# Patient Record
Sex: Female | Born: 1935 | Race: White | Hispanic: No | Marital: Married
Health system: Midwestern US, Academic
[De-identification: ages and names within clinical notes are randomized; demographics above are authoritative.]

---

## 2017-04-15 ENCOUNTER — Ambulatory Visit: Admit: 2017-04-15 | Discharge: 2017-04-16 | Payer: MEDICARE

## 2017-04-15 DIAGNOSIS — R011 Cardiac murmur, unspecified: Principal | ICD-10-CM

## 2017-04-16 ENCOUNTER — Encounter: Admit: 2017-04-16 | Discharge: 2017-04-16 | Payer: MEDICARE

## 2017-04-16 DIAGNOSIS — R011 Cardiac murmur, unspecified: Principal | ICD-10-CM

## 2017-06-02 ENCOUNTER — Encounter: Admit: 2017-06-02 | Discharge: 2017-06-02 | Payer: MEDICARE

## 2017-06-02 DIAGNOSIS — I4891 Unspecified atrial fibrillation: Principal | ICD-10-CM

## 2017-06-02 DIAGNOSIS — I10 Essential (primary) hypertension: ICD-10-CM

## 2017-06-18 ENCOUNTER — Encounter: Admit: 2017-06-18 | Discharge: 2017-06-18 | Payer: MEDICARE

## 2017-06-18 ENCOUNTER — Ambulatory Visit: Admit: 2017-06-18 | Discharge: 2017-06-19 | Payer: MEDICARE

## 2017-06-18 DIAGNOSIS — I480 Paroxysmal atrial fibrillation: ICD-10-CM

## 2017-06-18 DIAGNOSIS — I4891 Unspecified atrial fibrillation: Principal | ICD-10-CM

## 2017-06-18 DIAGNOSIS — I10 Essential (primary) hypertension: ICD-10-CM

## 2017-06-18 DIAGNOSIS — I350 Nonrheumatic aortic (valve) stenosis: Principal | ICD-10-CM

## 2017-06-18 NOTE — Assessment & Plan Note
She has very infrequent episodes of palpitation that only last for a few minutes.  There are no associated symptoms.

## 2017-06-18 NOTE — Assessment & Plan Note
The echocardiogram in June shows moderate aortic stenosis with normal left ventricular systolic function.  Her aortic stenosis is not symptomatic and I would not suggest any additional diagnostic studies at this time.

## 2017-06-18 NOTE — Progress Notes
Date of Service: 06/18/2017    Elizabeth Austin is a 83 y.o. female.       HPI     Elizabeth Austin and her husband were in the [place] office today for follow-up regarding paroxysmal atrial arrhythmias and moderate aortic stenosis.    Occasional episodes of fast heart beat lasting a few minutes.  No associated symptoms--no light headedness, dyspnea, or chest discomfort.         Vitals:    06/18/17 1446 06/18/17 1500   BP: 124/66 130/74   Pulse: 60    Weight: 50 kg (110 lb 3.2 oz)    Height: 1.499 m (4' 11)      Body mass index is 22.26 kg/m???.     Past Medical History  Patient Active Problem List    Diagnosis Date Noted   ??? Diastolic dysfunction 05/17/2013     2009 - Mild LV diastolic dysfunction by echocardiogram.  Mild LVH.  04/2013 - Mild LV diastolic dysfunction by echocardiogram.  Mild LVH.     ??? Osteoarthritis 05/17/2013     Circa 1993 - Pelvic fracture after fall  Chronic left hip pain.     ??? Spinal stenosis 05/17/2013   ??? Lower extremity edema 05/16/2013   ??? Paroxysmal atrial fibrillation (HCC) 05/16/2013     2009 - Event monitor [facility]) sinus rhythm  Circa 2011 - Anti-coagulation started by Dr. Shaune Spittle.    04/2012 - Atrial fibrillation by exam at Dr. Gilles Chiquito office     ??? Hypertension 05/16/2013   ??? Aortic stenosis 05/16/2013   ??? Hypothyroid 05/16/2013   ??? Abdominal pain 06/30/2011   ??? Constipation 06/30/2011         Review of Systems   Constitution: Positive for weakness.   HENT: Positive for hearing loss.    Eyes: Negative.    Cardiovascular: Negative.    Respiratory: Negative.    Endocrine: Negative.    Hematologic/Lymphatic: Negative.    Skin: Negative.    Musculoskeletal: Positive for arthritis and back pain.   Gastrointestinal: Positive for flatus.   Genitourinary: Negative.    Psychiatric/Behavioral: Negative.    Allergic/Immunologic: Negative.        Physical Exam    Physical Exam   General Appearance: no distress   Skin: warm, no ulcers or xanthomas   Digits and Nails: no cyanosis or clubbing Eyes: conjunctivae and lids normal, pupils are equal and round   Teeth/Gums/Palate: dentition unremarkable, no lesions   Lips & Oral Mucosa: no pallor or cyanosis   Neck Veins: normal JVP , neck veins are not distended   Thyroid: no nodules, masses, tenderness or enlargement   Chest Inspection: chest is normal in appearance   Respiratory Effort: breathing comfortably, no respiratory distress   Auscultation/Percussion: lungs clear to auscultation, no rales or rhonchi, no wheezing   PMI: PMI not enlarged or displaced   Cardiac Rhythm: regular rhythm and normal rate   Cardiac Auscultation: S1, S2 normal, no rub, no gallop   Murmurs: grade 2 systolic murmur   Peripheral Circulation: normal peripheral circulation   Carotid Arteries: normal carotid upstroke bilaterally, no bruits   Radial Arteries: normal symmetric radial pulses   Abdominal Aorta: no abdominal aortic bruit   Pedal Pulses: normal symmetric pedal pulses   Lower Extremity Edema: no lower extremity edema   Abdominal Exam: soft, non-tender, no masses, bowel sounds normal   Liver & Spleen: no organomegaly   Gait & Station: walks without assistance   Muscle Strength: normal muscle tone  Orientation: oriented to time, place and person   Affect & Mood: appropriate and sustained affect   Language and Memory: patient responsive and seems to comprehend information   Neurologic Exam: neurological assessment grossly intact   Other: moves all extremities      Cardiovascular Studies    EKG:  Sinus rhythm, rate 60.  RBBB.    Problems Addressed Today  Encounter Diagnoses   Name Primary?   ??? Nonrheumatic aortic valve stenosis    ??? Essential hypertension    ??? Paroxysmal atrial fibrillation (HCC)        Assessment and Plan       Aortic stenosis  The echocardiogram in June shows moderate aortic stenosis with normal left ventricular systolic function.  Her aortic stenosis is not symptomatic and I would not suggest any additional diagnostic studies at this time. Hypertension  Blood pressure seems nicely controlled on the current medical program.    Paroxysmal atrial fibrillation  She has very infrequent episodes of palpitation that only last for a few minutes.  There are no associated symptoms.      Current Medications (including today's revisions)  ??? acetaminophen (TYLENOL) 500 mg tablet Take 500 mg by mouth as Needed for Pain. Max of 4,000 mg of acetaminophen in 24 hours.   ??? antiox #8/om3/dha/epa/lut/zeax (PRESERVISION AREDS 2 (OMEGA-3) PO) Take 1 tablet by mouth daily.   ??? ergocalciferol (vitamin D2) (VITAMIN D PO) Take  by mouth daily.   ??? levothyroxine (SYNTHROID) 75 mcg tablet Take 75 mcg by mouth daily.   ??? lisinopril/hydrochlorothiazide (ZESTORETIC) 20/12.5 mg tablet Take  by mouth daily.   ??? metoprolol (LOPRESSOR) 25 mg tablet Take 25 mg by mouth daily.

## 2017-06-18 NOTE — Assessment & Plan Note
Blood pressure seems nicely controlled on the current medical program.

## 2017-08-10 ENCOUNTER — Emergency Department: Admit: 2017-08-10 | Discharge: 2017-08-10 | Payer: MEDICARE | Attending: Surgical Critical Care

## 2017-08-10 ENCOUNTER — Encounter: Admit: 2017-08-10 | Discharge: 2017-08-10 | Payer: MEDICARE

## 2017-08-10 DIAGNOSIS — M25561 Pain in right knee: ICD-10-CM

## 2017-08-10 DIAGNOSIS — S22029A Unspecified fracture of second thoracic vertebra, initial encounter for closed fracture: ICD-10-CM

## 2017-08-10 DIAGNOSIS — I4891 Unspecified atrial fibrillation: Principal | ICD-10-CM

## 2017-08-10 DIAGNOSIS — Z7409 Other reduced mobility: ICD-10-CM

## 2017-08-10 DIAGNOSIS — T1490XA Injury, unspecified, initial encounter: ICD-10-CM

## 2017-08-10 DIAGNOSIS — I10 Essential (primary) hypertension: ICD-10-CM

## 2017-08-10 MED ORDER — IOPAMIDOL 76 % IV SOLN
80 mL | Freq: Once | INTRAVENOUS | 0 refills | Status: CP
Start: 2017-08-10 — End: ?
  Administered 2017-08-11: 04:00:00 80 mL via INTRAVENOUS

## 2017-08-10 MED ORDER — FENTANYL CITRATE (PF) 50 MCG/ML IJ SOLN
25 ug | Freq: Once | INTRAVENOUS | 0 refills | Status: CP
Start: 2017-08-10 — End: ?
  Administered 2017-08-11: 04:00:00 25 ug via INTRAVENOUS

## 2017-08-10 MED ORDER — SODIUM CHLORIDE 0.9 % IJ SOLN
50 mL | Freq: Once | INTRAVENOUS | 0 refills | Status: CP
Start: 2017-08-10 — End: ?
  Administered 2017-08-11: 04:00:00 50 mL via INTRAVENOUS

## 2017-08-11 ENCOUNTER — Encounter: Admit: 2017-08-11 | Discharge: 2017-08-11 | Payer: MEDICARE

## 2017-08-11 ENCOUNTER — Inpatient Hospital Stay: Admit: 2017-08-11 | Discharge: 2017-08-11 | Payer: MEDICARE

## 2017-08-11 DIAGNOSIS — M25561 Pain in right knee: ICD-10-CM

## 2017-08-11 DIAGNOSIS — I4891 Unspecified atrial fibrillation: Principal | ICD-10-CM

## 2017-08-11 DIAGNOSIS — I10 Essential (primary) hypertension: ICD-10-CM

## 2017-08-11 LAB — URINALYSIS DIPSTICK
Lab: 6 mg/dL — ABNORMAL HIGH (ref 5.0–8.0)
Lab: NEGATIVE U/L (ref 7–40)
Lab: NEGATIVE g/dL (ref 3.5–5.0)
Lab: NEGATIVE g/dL — ABNORMAL HIGH (ref 6.0–8.0)
Lab: NEGATIVE mg/dL (ref 0.4–1.00)
Lab: NEGATIVE mg/dL (ref 8.5–10.6)
Lab: NEGATIVE mg/dL — ABNORMAL LOW (ref 0.3–1.2)
Lab: POSITIVE U/L — AB (ref 7–56)

## 2017-08-11 LAB — POC TROPONIN: Lab: 0 ng/mL (ref 0.00–0.05)

## 2017-08-11 LAB — COMPREHENSIVE METABOLIC PANEL
Lab: 135 MMOL/L — ABNORMAL LOW (ref 137–147)
Lab: 8 10*3/uL (ref 3–12)
Lab: >60 mL/min (ref >60)
Lab: >60 mL/min (ref >60)

## 2017-08-11 LAB — URINALYSIS, MICROSCOPIC

## 2017-08-11 LAB — PTT (APTT): Lab: 21 s (ref 20.0–36.0)

## 2017-08-11 LAB — CBC AND DIFF: Lab: 11 10*3/uL — ABNORMAL HIGH (ref 4.5–11.0)

## 2017-08-11 LAB — PROTIME INR (PT): Lab: 1 M/UL — ABNORMAL LOW (ref 0.8–1.2)

## 2017-08-11 LAB — POC CREATININE, RAD: Lab: 0.7 mg/dL (ref 0.4–1.00)

## 2017-08-11 MED ORDER — FENTANYL CITRATE (PF) 50 MCG/ML IJ SOLN
12.5-25 ug | INTRAVENOUS | 0 refills | Status: DC | PRN
Start: 2017-08-11 — End: 2017-08-12
  Administered 2017-08-11: 07:00:00 25 ug via INTRAVENOUS
  Administered 2017-08-11: 14:00:00 12.5 ug via INTRAVENOUS

## 2017-08-11 MED ORDER — LISINOPRIL-HYDROCHLOROTHIAZIDE 20-12.5 MG TAB
Freq: Every day | ORAL | 0 refills | Status: DC
Start: 2017-08-11 — End: 2017-08-13
  Administered 2017-08-11 – 2017-08-13 (×6): via ORAL

## 2017-08-11 MED ORDER — OXYCODONE 5 MG PO TAB
5-10 mg | Freq: Once | ORAL | 0 refills | Status: DC | PRN
Start: 2017-08-11 — End: 2017-08-12

## 2017-08-11 MED ORDER — FENTANYL CITRATE (PF) 50 MCG/ML IJ SOLN
25 ug | INTRAVENOUS | 0 refills | Status: DC | PRN
Start: 2017-08-11 — End: 2017-08-12

## 2017-08-11 MED ORDER — HYDROMORPHONE (PF) 2 MG/ML IJ SYRG
1 mg | INTRAVENOUS | 0 refills | Status: DC | PRN
Start: 2017-08-11 — End: 2017-08-12

## 2017-08-11 MED ORDER — KETAMINE 10 MG/ML IJ SOLN
0 refills | Status: DC
Start: 2017-08-11 — End: 2017-08-11
  Administered 2017-08-11 (×2): 10 mg via INTRAVENOUS

## 2017-08-11 MED ORDER — CEFAZOLIN INJ 1GM IVP
1 g | INTRAVENOUS | 0 refills | Status: CP
Start: 2017-08-11 — End: ?
  Administered 2017-08-12 (×2): 1 g via INTRAVENOUS

## 2017-08-11 MED ORDER — DIPHENHYDRAMINE HCL 50 MG/ML IJ SOLN
25 mg | Freq: Once | INTRAVENOUS | 0 refills | Status: DC | PRN
Start: 2017-08-11 — End: 2017-08-12

## 2017-08-11 MED ORDER — LACTATED RINGERS IV SOLP
1000 mL | INTRAVENOUS | 0 refills | Status: DC
Start: 2017-08-11 — End: 2017-08-12
  Administered 2017-08-11: 19:00:00 1000 mL via INTRAVENOUS

## 2017-08-11 MED ORDER — SENNOSIDES-DOCUSATE SODIUM 8.6-50 MG PO TAB
1 | Freq: Two times a day (BID) | ORAL | 0 refills | Status: DC
Start: 2017-08-11 — End: 2017-08-13
  Administered 2017-08-12 – 2017-08-13 (×4): 1 via ORAL

## 2017-08-11 MED ORDER — OXYCODONE 5 MG PO TAB
5 mg | ORAL | 0 refills | Status: DC | PRN
Start: 2017-08-11 — End: 2017-08-12
  Administered 2017-08-11: 10:00:00 5 mg via ORAL

## 2017-08-11 MED ORDER — HYDRALAZINE 20 MG/ML IJ SOLN
10 mg | INTRAVENOUS | 0 refills | Status: DC | PRN
Start: 2017-08-11 — End: 2017-08-11

## 2017-08-11 MED ORDER — PHENYLEPHRINE IN 0.9% NACL(PF) 1 MG/10 ML (100 MCG/ML) IV SYRG
INTRAVENOUS | 0 refills | Status: DC
Start: 2017-08-11 — End: 2017-08-11
  Administered 2017-08-11 (×3): 100 ug via INTRAVENOUS

## 2017-08-11 MED ORDER — PROPOFOL INJ 10 MG/ML IV VIAL
0 refills | Status: DC
Start: 2017-08-11 — End: 2017-08-11
  Administered 2017-08-11: 21:00:00 50 mg via INTRAVENOUS

## 2017-08-11 MED ORDER — ONDANSETRON HCL (PF) 4 MG/2 ML IJ SOLN
INTRAVENOUS | 0 refills | Status: DC
Start: 2017-08-11 — End: 2017-08-11
  Administered 2017-08-11: 22:00:00 4 mg via INTRAVENOUS

## 2017-08-11 MED ORDER — ROCURONIUM 10 MG/ML IV SOLN
INTRAVENOUS | 0 refills | Status: DC
Start: 2017-08-11 — End: 2017-08-11
  Administered 2017-08-11: 21:00:00 30 mg via INTRAVENOUS

## 2017-08-11 MED ORDER — DEXAMETHASONE SODIUM PHOSPHATE 4 MG/ML IJ SOLN
INTRAVENOUS | 0 refills | Status: DC
Start: 2017-08-11 — End: 2017-08-11
  Administered 2017-08-11: 21:00:00 4 mg via INTRAVENOUS

## 2017-08-11 MED ORDER — SUGAMMADEX 100 MG/ML IV SOLN
INTRAVENOUS | 0 refills | Status: DC
Start: 2017-08-11 — End: 2017-08-11
  Administered 2017-08-11: 23:00:00 110 mg via INTRAVENOUS

## 2017-08-11 MED ORDER — EPHEDRINE SULFATE 50 MG/ML IJ SOLN
0 refills | Status: DC
Start: 2017-08-11 — End: 2017-08-11
  Administered 2017-08-11: 21:00:00 10 mg via INTRAVENOUS

## 2017-08-11 MED ORDER — ACETAMINOPHEN 325 MG PO TAB
650 mg | ORAL | 0 refills | Status: DC | PRN
Start: 2017-08-11 — End: 2017-08-13
  Administered 2017-08-11 – 2017-08-13 (×8): 650 mg via ORAL

## 2017-08-11 MED ORDER — HYDROMORPHONE (PF) 2 MG/ML IJ SYRG
0 refills | Status: DC
Start: 2017-08-11 — End: 2017-08-11
  Administered 2017-08-11 (×2): .4 mg via INTRAVENOUS

## 2017-08-11 MED ORDER — FENTANYL CITRATE (PF) 50 MCG/ML IJ SOLN
25 ug | Freq: Once | INTRAVENOUS | 0 refills | Status: CP
Start: 2017-08-11 — End: ?
  Administered 2017-08-11: 06:00:00 25 ug via INTRAVENOUS

## 2017-08-11 MED ORDER — HALOPERIDOL LACTATE 5 MG/ML IJ SOLN
1 mg | Freq: Once | INTRAVENOUS | 0 refills | Status: DC | PRN
Start: 2017-08-11 — End: 2017-08-12

## 2017-08-11 MED ORDER — CEFAZOLIN 1 GRAM IJ SOLR
0 refills | Status: DC
Start: 2017-08-11 — End: 2017-08-11
  Administered 2017-08-11: 21:00:00 2 g via INTRAVENOUS

## 2017-08-11 MED ORDER — DEXTRAN 70-HYPROMELLOSE (PF) 0.1-0.3 % OP DPET
0 refills | Status: DC
Start: 2017-08-11 — End: 2017-08-11
  Administered 2017-08-11: 21:00:00 1 [drp] via OPHTHALMIC

## 2017-08-11 MED ORDER — GABAPENTIN 300 MG PO CAP
300 mg | Freq: Once | ORAL | 0 refills | Status: CP
Start: 2017-08-11 — End: ?
  Administered 2017-08-11: 20:00:00 300 mg via ORAL

## 2017-08-11 MED ORDER — FENTANYL CITRATE (PF) 50 MCG/ML IJ SOLN
0 refills | Status: DC
Start: 2017-08-11 — End: 2017-08-11
  Administered 2017-08-11: 21:00:00 100 ug via INTRAVENOUS

## 2017-08-11 MED ORDER — HYDRALAZINE 20 MG/ML IJ SOLN
10 mg | INTRAVENOUS | 0 refills | Status: DC | PRN
Start: 2017-08-11 — End: 2017-08-13
  Administered 2017-08-13: 08:00:00 10 mg via INTRAVENOUS

## 2017-08-11 MED ORDER — METOPROLOL TARTRATE 25 MG PO TAB
25 mg | Freq: Every day | ORAL | 0 refills | Status: DC
Start: 2017-08-11 — End: 2017-08-12
  Administered 2017-08-11 – 2017-08-12 (×2): 25 mg via ORAL

## 2017-08-11 MED ORDER — PROMETHAZINE 25 MG/ML IJ SOLN
6.25 mg | INTRAVENOUS | 0 refills | Status: DC | PRN
Start: 2017-08-11 — End: 2017-08-12

## 2017-08-11 MED ORDER — HYDROMORPHONE (PF) 2 MG/ML IJ SYRG
.5 mg | INTRAVENOUS | 0 refills | Status: DC | PRN
Start: 2017-08-11 — End: 2017-08-12

## 2017-08-11 MED ORDER — LIDOCAINE (PF) 200 MG/10 ML (2 %) IJ SYRG
0 refills | Status: DC
Start: 2017-08-11 — End: 2017-08-11
  Administered 2017-08-11: 21:00:00 60 mg via INTRAVENOUS

## 2017-08-11 MED ORDER — PHENYLEPHRINE IV DRIP (STD CONC)
0 refills | Status: DC
Start: 2017-08-11 — End: 2017-08-11
  Administered 2017-08-11 (×2): 0.4 ug/kg/min via INTRAVENOUS

## 2017-08-11 MED ORDER — CELECOXIB 200 MG PO CAP
200 mg | Freq: Once | ORAL | 0 refills | Status: CP
Start: 2017-08-11 — End: ?
  Administered 2017-08-11: 20:00:00 200 mg via ORAL

## 2017-08-11 MED ORDER — LEVOTHYROXINE 75 MCG PO TAB
75 ug | Freq: Every day | ORAL | 0 refills | Status: DC
Start: 2017-08-11 — End: 2017-08-13
  Administered 2017-08-11 – 2017-08-13 (×3): 75 ug via ORAL

## 2017-08-11 NOTE — ED Notes
Pt cleared of C-spine per MD
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
## 2017-08-11 NOTE — ED Notes
BH 1914-7 (ready) please call Sophie at [phone]

## 2017-08-11 NOTE — ED Notes
Pt back from CT, and states her pain is coming back. Pt given ice-pack for knee, and Dr. Aldine Contes to be notified. Pt denies other needs at this time. Bed in lowest locked position, call light in reach.

## 2017-08-11 NOTE — Progress Notes
Patient arrived to room # (5114/2) via cart accompanied by transport. Patient transferred to the bed with assistance. Bedside safety checks completed. Initial patient assessment completed, refer to flowsheet for details. Admission skin assessment completed by:   Vania Rea and Darden Dates  Pressure Injury Present on Hospital Admission (within 24 hours): No    1. Occiput: No  2. Ear: No  3. Scapula: No  4. Spinous Process: No  5. Shoulder: No  6. Elbow: No  7. Iliac Crest: No  8. Sacrum/Coccyx: No  9. Ischial Tuberosity: No  10. Trochanter: No  11. Knee: No  12. Malleolus: No  13. Heel: No  14. Toes: No  15. Assessed for device associated injury No  16. Nursing Nutrition Assessment Completed No    See Doc Flowsheet for additional wound details.     INTERVENTIONS:

## 2017-08-11 NOTE — Other
Brief Operative Note    Name: Elizabeth Austin is a 83 y.o. female     DOB: [birth year]             MRN#: [id number]  DATE OF OPERATION: 08/11/2017    Date:  08/11/2017        Preoperative Dx:   Closed displaced fracture of left patella, unspecified fracture morphology, initial encounter [S82.002A]    Post-op Diagnosis      * Closed displaced fracture of left patella, unspecified fracture morphology, initial encounter [S82.002A]    Procedure(s) (LRB):  OPEN TREATMENT PATELLAR FRACTURE WITH INTERNAL FIXATION (Left)     Removal of distal pole of patella with attachment via fiberwire    Anesthesia Type: Defer to Anesthesia    Surgeon(s) and Role:     * Jaeson Molstad, Janalyn Rouse, MD - Resident - Assisting     * Sojka, Margit Banda, MD - Primary      Findings:  L patellar fracture    Estimated Blood Loss: 50 ml    Specimen(s) Removed/Disposition: * No specimens in log *    Complications:  None    Implants: fiber wire    Drains: None    Disposition:  PACU - stable    Geraldine Contras, MD  Pager [phone]    LLE - WBAT in hinged knee brace locked in extension  anticoag per trauma team  ancef 24 hours - ordered

## 2017-08-11 NOTE — ED Notes
Trauma consult activated

## 2017-08-11 NOTE — Progress Notes
ORTHOTICS/PROSTHETICS              NAME: Elizabeth Austin  ROOM: GN5621/30  DIAGNOSIS: Lt Patella Fx  DATE OF INITIAL CONSULT: 08/11/17    No delivery made. Left message w/ Unit Secretary to have RN order 16" Knee Immobilizer from Materials Dept if it has not been already.     [name]

## 2017-08-11 NOTE — ED Notes
Report given to RN on unit 51.

## 2017-08-11 NOTE — ED Notes
Pt to CT.

## 2017-08-11 NOTE — Progress Notes
OCCUPATIONAL THERAPY   NOTE      OT orders recevied.  Pt to OR with ortho for repair of patellar fx.  OT will follow-up 10/10        Therapist: Rogene Houston, OT  Date: 08/11/2017
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
## 2017-08-11 NOTE — Progress Notes
ORTHOTICS/PROSTHETICS  Consult Note:      Austin: Elizabeth Austin  ADMISSION DATE: admitted to hospital  DOB: 1926-03-24  AGE: 83 y.o.  ROOM: BH2 OR RM/BH2 OR BD  DOCTOR:     Date of Order: 08/11/17  Date of Service: 08/11/17    Services referred for: Orthotic Eval and Treat: Post-op knee brace    Description of condition/injury, including services:s/p Lt patella ORIF    Size: Reg DonJoy Telescoping Post Op KO  Side Lt      Measurements: Area/circumference/Diameter/Length None    Functional Goals discussed for device use:  Joint stabilization (support and alignment)  Increase or decrease range of motion  Decrease pain    Device is to be ordered No    Estimated date of delivery Today    Functional goals met: Post Op KO delivered to OR for surgical team to fit.     The patient states satisfaction with the fit/function of device: n/a pt sedated    Additional supplies provided to the patient: None    Patient Education:  Written and/or verbal instruction/information to be provided to patient by surgical team.  Information provided: device function, usage/break-in period, donning/doffing of device, care and cleaning and benefits and precautions      Patient did tolerate the procedure without incident/problem.    Follow-up scheduled: PRN    PLAN:  Order completed    Dahlia Client  08/11/2017
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
## 2017-08-11 NOTE — Progress Notes
Neurosurgery Update Note:    Patient staffed with on-call neurosurgery attending.  No acute neurosurgical intervention needed.  Does not need any spinal precautions or follow up.  Patient seen this AM and not having any complaints of significant back pain.  Her only pain is currently in her front chest and her knees.    We appreciate the consult on this patient and allowing [place] to be part of their care team.  Neurosurgery at this time will signoff inpatient care.  Please call with further questions or concerns, [phone].    Marlynn Perking, MD  [phone]

## 2017-08-11 NOTE — ED Notes
Monitor showing sp02 saturations in low 90s and upper 80. Pt placed on 1L oxygen via NC which saturations back up in 90s. Pt remains laying awake in NAD at this time.

## 2017-08-11 NOTE — Progress Notes
I have reviewed the notes, assessment, and/or procedures performed by Colby Rankin, RN and concur with her/his documentation unless otherwise noted.

## 2017-08-11 NOTE — Progress Notes
For prep for surgery, Pt earrings removed, placed in denture cup and locked in cabinet in the room.

## 2017-08-11 NOTE — Progress Notes
PHYSICAL THERAPY  NOTE       Patient was unavailable for physical therapy; going to OR with ortho this date.  Physical therapy will continue to follow and provide intervention as indicated.      Therapist: Eddie Candle, PT  Date: 08/11/2017

## 2017-08-11 NOTE — ED Notes
Pt remains laying flat in cart and resting. Pt states her pain is better after ice pack and medication. Pt updated on plan of care, and pt denies any needs. Family at bedside. Bed in lowest locked position, call light in reach.

## 2017-08-12 ENCOUNTER — Encounter: Admit: 2017-08-12 | Discharge: 2017-08-12 | Payer: MEDICARE

## 2017-08-12 LAB — CULTURE-URINE W/SENSITIVITY: Lab: 3

## 2017-08-12 LAB — BASIC METABOLIC PANEL: Lab: 133 MMOL/L — ABNORMAL LOW (ref 137–147)

## 2017-08-12 LAB — CBC: Lab: 8.4 10*3/uL — ABNORMAL LOW (ref >60)

## 2017-08-12 LAB — PHOSPHORUS: Lab: 2.9 mg/dL — ABNORMAL HIGH (ref >60)

## 2017-08-12 LAB — MAGNESIUM: Lab: 1.9 mg/dL — ABNORMAL HIGH (ref 1.6–2.6)

## 2017-08-12 MED ORDER — METOPROLOL TARTRATE 25 MG PO TAB
25 mg | Freq: Two times a day (BID) | ORAL | 0 refills | Status: DC
Start: 2017-08-12 — End: 2017-08-13
  Administered 2017-08-13 (×2): 25 mg via ORAL

## 2017-08-12 MED ORDER — TRAMADOL 50 MG PO TAB
50 mg | ORAL | 0 refills | Status: DC | PRN
Start: 2017-08-12 — End: 2017-08-13
  Administered 2017-08-12 – 2017-08-13 (×3): 50 mg via ORAL

## 2017-08-12 MED ORDER — BISACODYL 10 MG RE SUPP
10 mg | Freq: Every day | RECTAL | 0 refills | Status: DC | PRN
Start: 2017-08-12 — End: 2017-08-13
  Administered 2017-08-13: 17:00:00 10 mg via RECTAL

## 2017-08-12 MED ORDER — POLYETHYLENE GLYCOL 3350 17 GRAM PO PWPK
1 | Freq: Two times a day (BID) | ORAL | 0 refills | Status: DC
Start: 2017-08-12 — End: 2017-08-13
  Administered 2017-08-13 (×2): 17 g via ORAL

## 2017-08-12 MED ORDER — ENOXAPARIN 30 MG/0.3 ML SC SYRG
30 mg | Freq: Two times a day (BID) | SUBCUTANEOUS | 0 refills | Status: DC
Start: 2017-08-12 — End: 2017-08-13
  Administered 2017-08-12 – 2017-08-13 (×3): 30 mg via SUBCUTANEOUS

## 2017-08-12 MED ORDER — POTASSIUM PHOSPHATE, MONOBASIC 500 MG PO TBSO
2 | Freq: Once | ORAL | 0 refills | Status: CP
Start: 2017-08-12 — End: ?
  Administered 2017-08-12: 14:00:00 2 via ORAL

## 2017-08-12 NOTE — Anesthesia Post-Procedure Evaluation
Post-Anesthesia Evaluation    Name: Elizabeth Austin      MRN: [id number]     DOB: [birth year]     Age: 83 y.o.     Sex: female   __________________________________________________________________________     Procedure Date: 08/11/2017  Procedure: Procedure(s):  OPEN TREATMENT PATELLAR FRACTURE WITH INTERNAL FIXATION      Surgeon: Surgeon(s):  Azzie Glatter, MD  Geraldine Contras, MD    Post-Anesthesia Vitals  BP: 454/09 [phone] 1915)  Pulse: 73 (10/09 1915)  Respirations: 16 PER MINUTE (10/09 1915)  SpO2: 93 % (10/09 1915)  SpO2 Pulse: 73 (10/09 1915)      Post Anesthesia Evaluation Note    Evaluation location: Pre/Post  Patient participation: recovered; patient participated in evaluation  Level of consciousness: alert    Pain score: 0  Pain management: adequate    Hydration: normovolemia  Temperature: 36.0C - 38.4C  Airway patency: adequate    Perioperative Events  Perioperative events:  no       Post-op nausea and vomiting: no PONV    Postoperative Status  Cardiovascular status: hemodynamically stable  Respiratory status: spontaneous ventilation and supplemental oxygen  Follow-up needed: none        Perioperative Events  Perioperative Event: No  Emergency Case Activation: No
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
## 2017-08-12 NOTE — Case Management (ED)
Discharge Planning    No acute events overnight. OR yesterday with ortho for repair of L knee fx. Neuro exam intact. Pain control with tylenol, fentanyl, oxycodone. VSS-RA. NPO for OR today with ortho. Voids. OT/PT- walked in room 72ft, walker SBA- rec home with assistance v inpatient setting- SW following.     LLE WBAT- HKB locked in ext      [facility]- nursing, OT and PT orders sent to [facility] phone [phone] Fax [phone]    DME- RW sent to [facility], RN, BSN  Trauma Case Manager  Phone: [phone]  Pager: [phone]  Fax: [phone]

## 2017-08-12 NOTE — Anesthesia Pain Rounding
Anesthesia Follow-Up Evaluation: Post-Procedure Day One    Name: Elizabeth Austin     MRN: [id number]     DOB: [birth year]     Age: 83 y.o.     Sex: female   __________________________________________________________________________     Procedure Date: 08/11/2017   Procedure: Procedure(s):  OPEN TREATMENT PATELLAR FRACTURE WITH INTERNAL FIXATION    Physical Assessment  Height: 157.5 cm (62)  Weight: 52.1 kg (114 lb 13.8 oz)    Vital Signs (Last Filed in 24 hours)  BP: 149/70 (10/10 0745)  Temp: 36.9 ???C (98.5 ???F) (10/10 0745)  Pulse: 80 (10/10 0745)  Respirations: 22 PER MINUTE (10/10 0745)  SpO2: 96 % (10/10 0745)  O2 Delivery: None (Room Air) (10/10 0745)  SpO2 Pulse: 72 (10/09 2129)    Patient History   Allergies  Allergies   Allergen Reactions   ??? Shellfish Containing Products EDEMA        Medications  Scheduled Meds:  ceFAZolin (ANCEF) IVP 1 g 1 g Intravenous Q8H*   enoxaparin (LOVENOX) syringe 30 mg 30 mg Subcutaneous BID   levothyroxine (SYNTHROID) tablet 75 mcg 75 mcg Oral QDAY   lisinopril (PRINIVIL; ZESTRIL) 20 mg, hydroCHLOROthiazide (HYDRODIURIL) 12.5 mg per each dose  Oral QDAY   metoprolol tartrate (LOPRESSOR) tablet 25 mg 25 mg Oral QDAY   potassium phosphate (K-PHOS ORIGINAL) dispersable tablet 2 tablet 2 tablet Oral ONCE   senna/docusate (SENOKOT-S) tablet 1 tablet 1 tablet Oral BID   Continuous Infusions:  PRN and Respiratory Meds:acetaminophen Q4H PRN, hydrALAZINE Q6H PRN, oxyCODONE Q4H PRN      Diagnostic Tests  Hematology: Lab Results   Component Value Date    HGB 10.4 08/12/2017    HCT 32.0 08/12/2017    PLTCT 174 08/12/2017    WBC 8.4 08/12/2017    NEUT 88 08/10/2017    ANC 10.10 08/10/2017    ALC 0.80 08/10/2017    MONA 5 08/10/2017    AMC 0.50 08/10/2017    EOSA 0 08/10/2017    ABC 0.00 08/10/2017    MCV 104.8 08/12/2017    MCH 34.0 08/12/2017    MCHC 32.4 08/12/2017    MPV 8.9 08/12/2017    RDW 15.7 08/12/2017         General Chemistry: Lab Results   Component Value Date    NA 133 08/12/2017 K 3.8 08/12/2017    CL 96 08/12/2017    CO2 31 08/12/2017    GAP 6 08/12/2017    BUN 16 08/12/2017    CR 0.70 08/12/2017    GLU 140 08/12/2017    CA 8.6 08/12/2017    ALBUMIN 3.9 08/10/2017    MG 1.9 08/12/2017    TOTBILI 0.5 08/10/2017    PO4 2.9 08/12/2017      Coagulation:   Lab Results   Component Value Date    PTT 21.9 08/10/2017    INR 1.0 08/10/2017         Follow-Up Assessment  Patient location during evaluation: floor      Anesthetic Complications:   Anesthetic complications: The patient did not experience any anesthestic complications.      Pain:  Score: 2    Management:adequate     Level of Consciousness: awake and alert   Hydration:acceptable     Airway Patency: patent   Respiratory Status: acceptable     Cardiovascular Status:acceptable   Regional/Neuroaxial:
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
## 2017-08-13 ENCOUNTER — Emergency Department: Admit: 2017-08-10 | Discharge: 2017-08-10 | Payer: MEDICARE | Attending: Surgical Critical Care

## 2017-08-13 ENCOUNTER — Inpatient Hospital Stay: Admit: 2017-08-11 | Discharge: 2017-08-11 | Payer: MEDICARE

## 2017-08-13 ENCOUNTER — Encounter: Admit: 2017-08-13 | Discharge: 2017-08-13 | Payer: MEDICARE

## 2017-08-13 ENCOUNTER — Inpatient Hospital Stay
Admit: 2017-08-11 | Discharge: 2017-08-13 | Disposition: A | Discharge status: Home / Self Care | Payer: MEDICARE | Attending: Surgical Critical Care | Admitting: Surgical Critical Care

## 2017-08-13 DIAGNOSIS — M810 Age-related osteoporosis without current pathological fracture: ICD-10-CM

## 2017-08-13 DIAGNOSIS — S22028A Other fracture of second thoracic vertebra, initial encounter for closed fracture: ICD-10-CM

## 2017-08-13 DIAGNOSIS — S82032A Displaced transverse fracture of left patella, initial encounter for closed fracture: Principal | ICD-10-CM

## 2017-08-13 DIAGNOSIS — D62 Acute posthemorrhagic anemia: ICD-10-CM

## 2017-08-13 DIAGNOSIS — I350 Nonrheumatic aortic (valve) stenosis: ICD-10-CM

## 2017-08-13 DIAGNOSIS — E039 Hypothyroidism, unspecified: ICD-10-CM

## 2017-08-13 DIAGNOSIS — R739 Hyperglycemia, unspecified: ICD-10-CM

## 2017-08-13 DIAGNOSIS — T380X5A Adverse effect of glucocorticoids and synthetic analogues, initial encounter: ICD-10-CM

## 2017-08-13 DIAGNOSIS — I4891 Unspecified atrial fibrillation: Principal | ICD-10-CM

## 2017-08-13 DIAGNOSIS — I10 Essential (primary) hypertension: ICD-10-CM

## 2017-08-13 DIAGNOSIS — Z87891 Personal history of nicotine dependence: ICD-10-CM

## 2017-08-13 DIAGNOSIS — M1990 Unspecified osteoarthritis, unspecified site: ICD-10-CM

## 2017-08-13 DIAGNOSIS — R918 Other nonspecific abnormal finding of lung field: ICD-10-CM

## 2017-08-13 DIAGNOSIS — I480 Paroxysmal atrial fibrillation: ICD-10-CM

## 2017-08-13 DIAGNOSIS — G8911 Acute pain due to trauma: ICD-10-CM

## 2017-08-13 DIAGNOSIS — S2220XA Unspecified fracture of sternum, initial encounter for closed fracture: ICD-10-CM

## 2017-08-13 LAB — 25-OH VITAMIN D (D2 + D3): Lab: 31 ng/mL (ref 30–80)

## 2017-08-13 MED ORDER — MAGNESIUM SULFATE IN D5W 1 GRAM/100 ML IV PGBK
1 g | INTRAVENOUS | 0 refills | Status: DC
Start: 2017-08-13 — End: 2017-08-13

## 2017-08-13 MED ORDER — POLYETHYLENE GLYCOL 3350 17 GRAM PO PWPK
17 g | Freq: Two times a day (BID) | ORAL | 0 refills | 18.00000 days | Status: AC
Start: 2017-08-13 — End: ?

## 2017-08-13 MED ORDER — POTASSIUM PHOSPHATE, MONOBASIC 500 MG PO TBSO
2 | Freq: Once | ORAL | 0 refills | Status: DC
Start: 2017-08-13 — End: 2017-08-13

## 2017-08-13 MED ORDER — SENNOSIDES-DOCUSATE SODIUM 8.6-50 MG PO TAB
1 | Freq: Two times a day (BID) | ORAL | 0 refills | Status: AC
Start: 2017-08-13 — End: ?

## 2017-08-13 MED ORDER — TRAMADOL 50 MG PO TAB
50 mg | ORAL_TABLET | ORAL | 0 refills | Status: AC | PRN
Start: 2017-08-13 — End: ?
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
## 2017-08-13 NOTE — Progress Notes
I have reviewed the notes, assessment, and/or procedures performed by Colby Rankin, RN and concur with her/his documentation unless otherwise noted.
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
## 2017-08-13 NOTE — Progress Notes
Nathaniel Man discharged on 08/13/2017.   Marland Kitchen  Discharge instructions reviewed with patient and family.  Valuables returned:   Personal Items / Valuables: None.  Home medications:    .  Functional assessment at discharge complete: Yes .
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
## 2017-08-28 ENCOUNTER — Encounter: Admit: 2017-08-28 | Discharge: 2017-08-28 | Payer: MEDICARE

## 2017-08-28 DIAGNOSIS — S82002D Unspecified fracture of left patella, subsequent encounter for closed fracture with routine healing: Principal | ICD-10-CM

## 2017-08-28 DIAGNOSIS — Z9889 Other specified postprocedural states: ICD-10-CM

## 2017-08-31 ENCOUNTER — Ambulatory Visit: Admit: 2017-08-31 | Discharge: 2017-08-31 | Payer: MEDICARE

## 2017-08-31 ENCOUNTER — Encounter: Admit: 2017-08-31 | Discharge: 2017-08-31 | Payer: MEDICARE

## 2017-08-31 DIAGNOSIS — S82002D Unspecified fracture of left patella, subsequent encounter for closed fracture with routine healing: Principal | ICD-10-CM

## 2017-08-31 DIAGNOSIS — I10 Essential (primary) hypertension: ICD-10-CM

## 2017-08-31 DIAGNOSIS — I4891 Unspecified atrial fibrillation: Principal | ICD-10-CM

## 2017-08-31 DIAGNOSIS — Z9889 Other specified postprocedural states: ICD-10-CM

## 2017-09-17 ENCOUNTER — Encounter: Admit: 2017-09-17 | Discharge: 2017-09-17 | Payer: MEDICARE

## 2017-09-17 ENCOUNTER — Ambulatory Visit: Admit: 2017-09-17 | Discharge: 2017-09-18 | Payer: MEDICARE

## 2017-09-17 DIAGNOSIS — I10 Essential (primary) hypertension: ICD-10-CM

## 2017-09-17 DIAGNOSIS — I4891 Unspecified atrial fibrillation: Principal | ICD-10-CM

## 2017-09-17 DIAGNOSIS — S82002D Unspecified fracture of left patella, subsequent encounter for closed fracture with routine healing: Principal | ICD-10-CM

## 2018-02-04 ENCOUNTER — Encounter: Admit: 2018-02-04 | Discharge: 2018-02-04 | Payer: MEDICARE

## 2018-02-04 DIAGNOSIS — I4891 Unspecified atrial fibrillation: Principal | ICD-10-CM

## 2018-02-04 DIAGNOSIS — I10 Essential (primary) hypertension: ICD-10-CM

## 2020-06-03 DEATH — deceased

## 2021-06-23 IMAGING — CT BRAIN WO(Adult)
2 of 3 series · 14 of 30 positions shown, 17 images · non-contrast
Comparison: none

[Series 2: brain ax 5.00 hr40 s3 · axial · 0.31mm/px · z∈[-547,-436]mm · 11 of 22 slices shown, 14 images]
[im 2/22  brain]
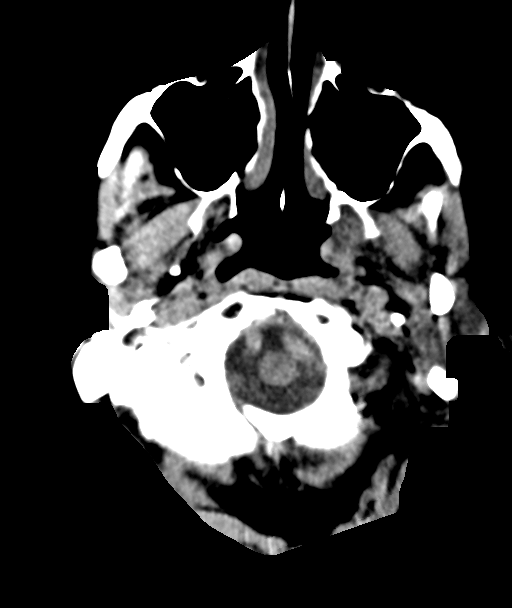
[im 2/22  bone]
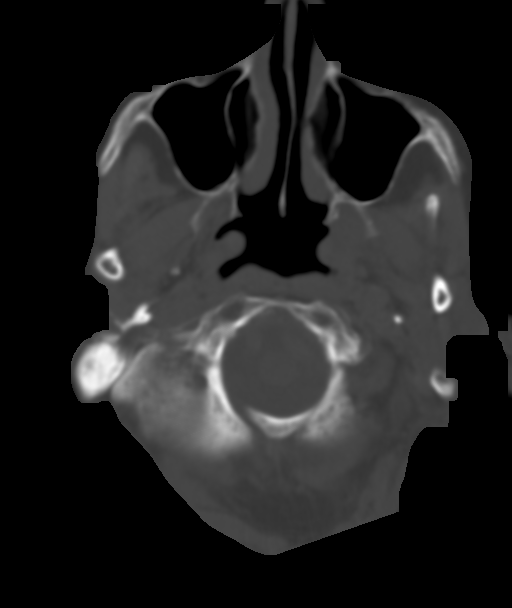
[im 4/22  brain]
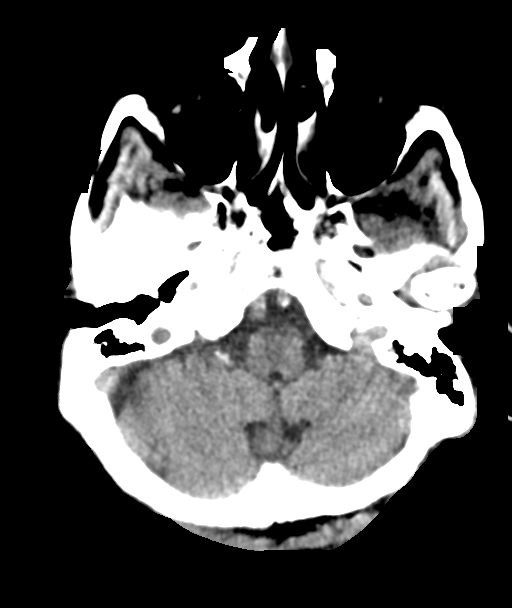
[im 6/22  brain]
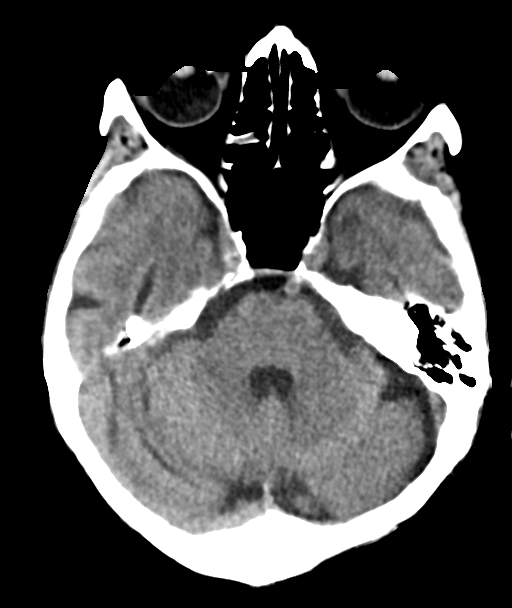
[im 8/22  brain]
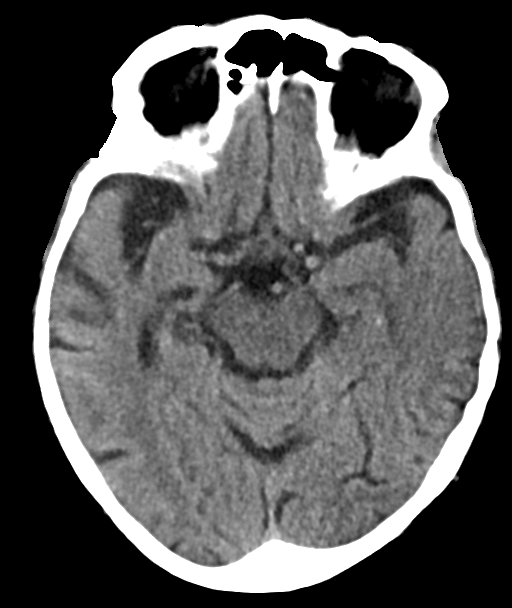
[im 10/22  brain]
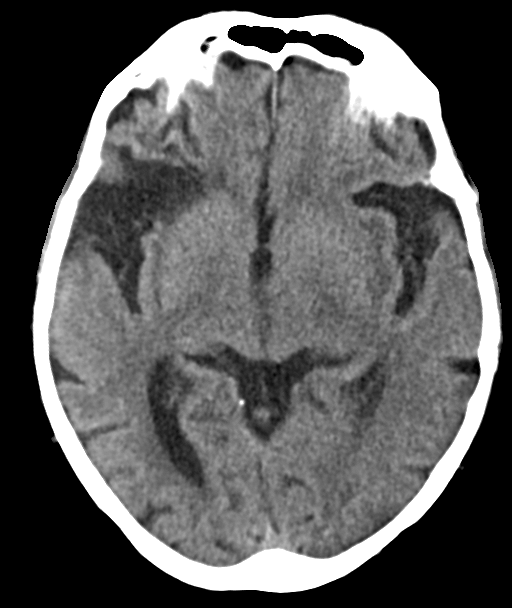
[im 10/22  bone]
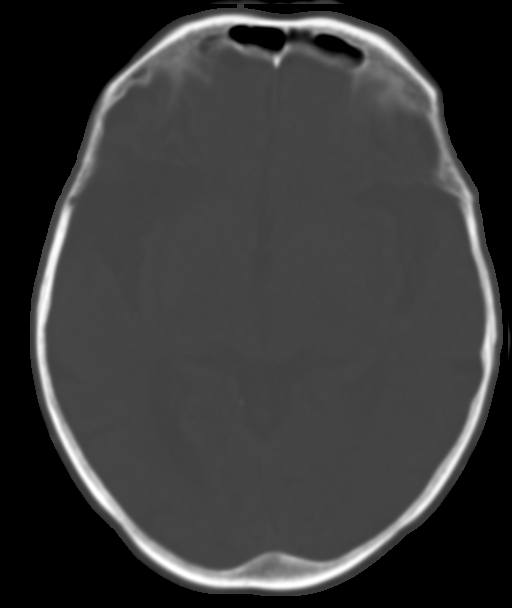
[im 12/22  brain]
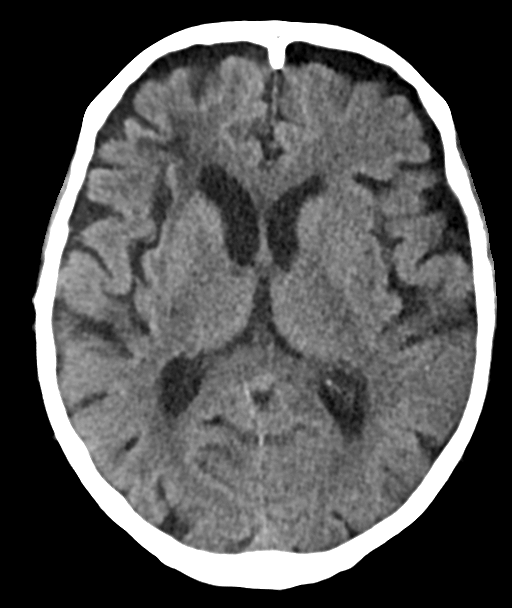
[im 13/22  brain]
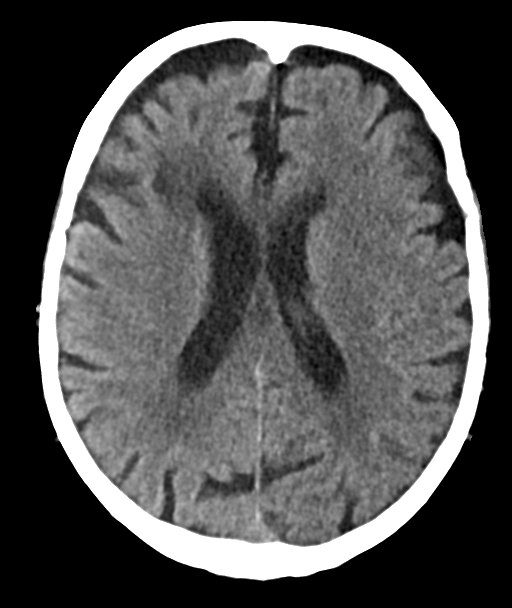
[im 15/22  brain]
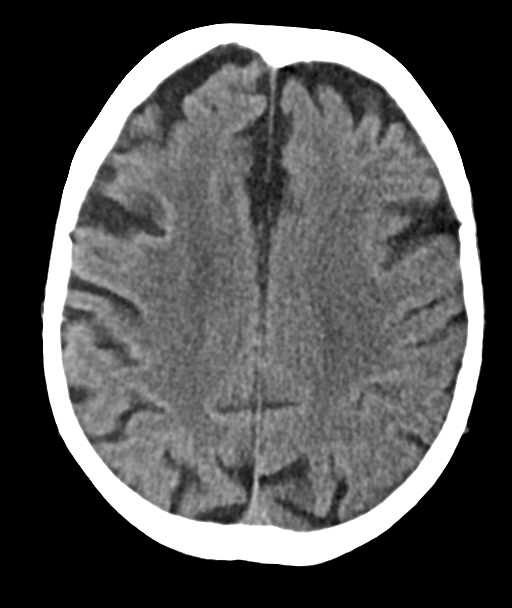
[im 17/22  brain]
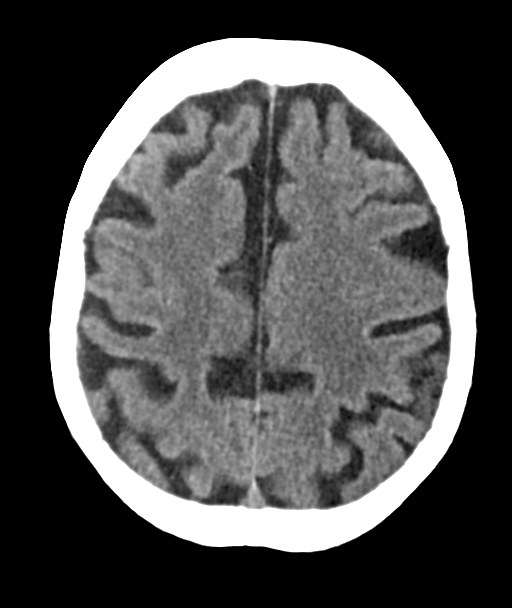
[im 17/22  bone]
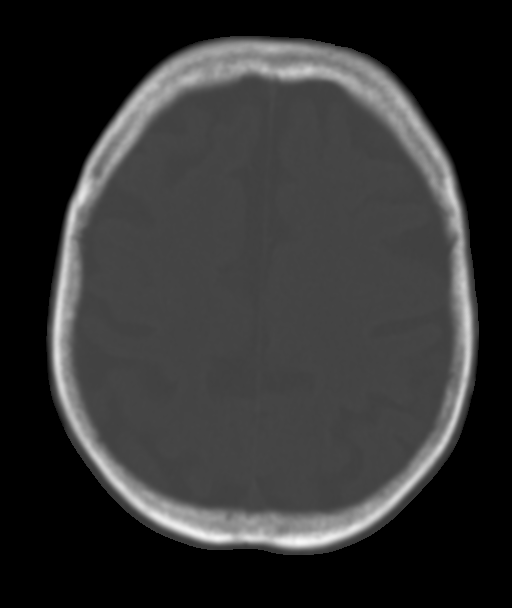
[im 19/22  brain]
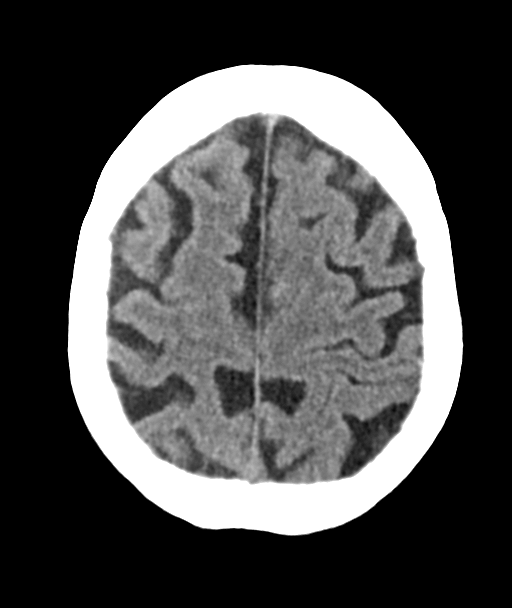
[im 21/22  brain]
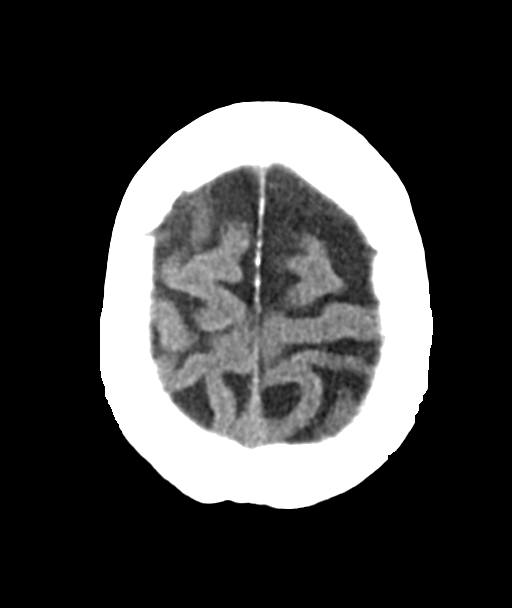

[Series 4: brain cor 5.00 hr40 s3 · coronal · 0.31mm/px · 3 of 6 slices shown]
[im 2/6  brain]
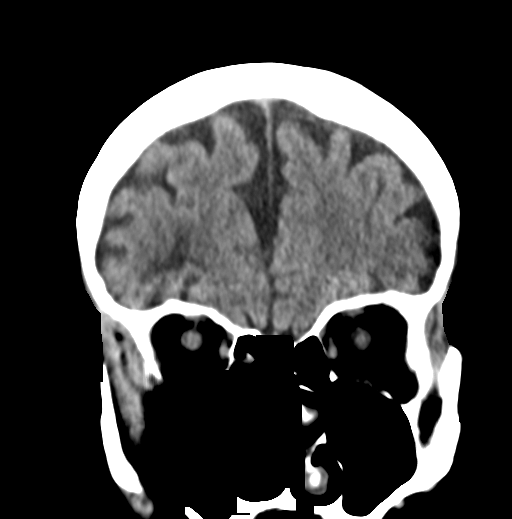
[im 3/6  brain]
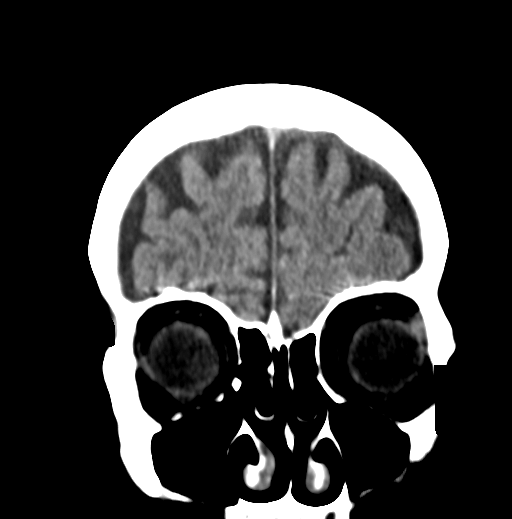
[im 4/6  brain]
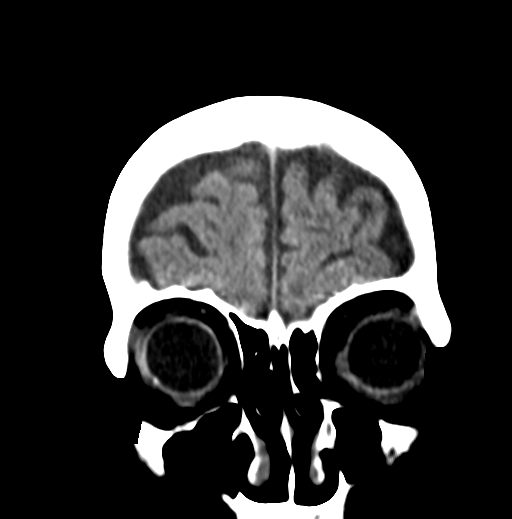

[14 of 30 positions shown; findings below may reference images not displayed]

DIAGNOSTIC STUDIES

EXAM

CT scan of the head without contrast.

INDICATION

altered mental status
ALTERED MENTAL STATUS, CONFUSION, LOW BP, HR 140s. RG/TB

TECHNIQUE

All CT scans at this facility use dose modulation, iterative reconstruction, and/or weight based
dosing when appropriate to reduce radiation dose to as low as reasonably achievable.

Number of previous computed tomography exams in the last 12 months is 1.

Number of previous nuclear medicine myocardial perfusion studies in the last 12 months is 0  .

COMPARISONS

None available

FINDINGS

Extensive cortical atrophy is seen with proportional enlargement of the ventricular system. Small
vessel ischemic changes noted with more focal diminished attenuation in the right frontal lobe white
matter likely indicating old infarct. Bony calvarium is normal. No intracranial hemorrhage or mass
effect is seen.

IMPRESSION

Old right frontal lobe white matter infarct. There is extensive cortical atrophy and small vessel
ischemic change. No intracranial hemorrhage or mass effect is seen. Report called to Uyara Sanon

Tech Notes:

ALTERED MENTAL STATUS, CONFUSION, LOW BP, HR 140s. RG/TB

## 2024-06-01 ENCOUNTER — Encounter: Admit: 2024-06-01 | Discharge: 2024-06-01 | Payer: MEDICARE
